# Patient Record
Sex: Female | Born: 1980 | Race: White | Hispanic: No | Marital: Married | Smoking: Former smoker
Health system: Southern US, Community
[De-identification: ages and names within clinical notes are randomized; demographics above are authoritative.]

## PROBLEM LIST (undated history)

## (undated) DIAGNOSIS — E785 Hyperlipidemia, unspecified: Secondary | ICD-10-CM

---

## 2001-04-14 ENCOUNTER — Other Ambulatory Visit: Admission: RE | Admit: 2001-04-14 | Discharge: 2001-04-14 | Payer: Self-pay | Admitting: Family Medicine

## 2008-11-11 ENCOUNTER — Ambulatory Visit (HOSPITAL_COMMUNITY): Admission: RE | Admit: 2008-11-11 | Discharge: 2008-11-11 | Payer: Self-pay | Admitting: Obstetrics and Gynecology

## 2009-04-17 ENCOUNTER — Encounter: Admission: RE | Admit: 2009-04-17 | Discharge: 2009-04-17 | Payer: Self-pay | Admitting: Orthopedic Surgery

## 2009-09-28 ENCOUNTER — Ambulatory Visit (HOSPITAL_COMMUNITY): Admission: RE | Admit: 2009-09-28 | Discharge: 2009-09-28 | Payer: Self-pay | Admitting: Obstetrics and Gynecology

## 2011-03-04 IMAGING — US US FOLLICLE
1 series · 14 of 16 positions shown · non-contrast
Comparison: None.

CLINICAL DATA: Infertility.  Follicle study

US PELVIS TRANSVAGINAL

[Series 1: us follicle · 0.12mm/px · 25 acquisitions, 14 frames shown]
[im 1/25]
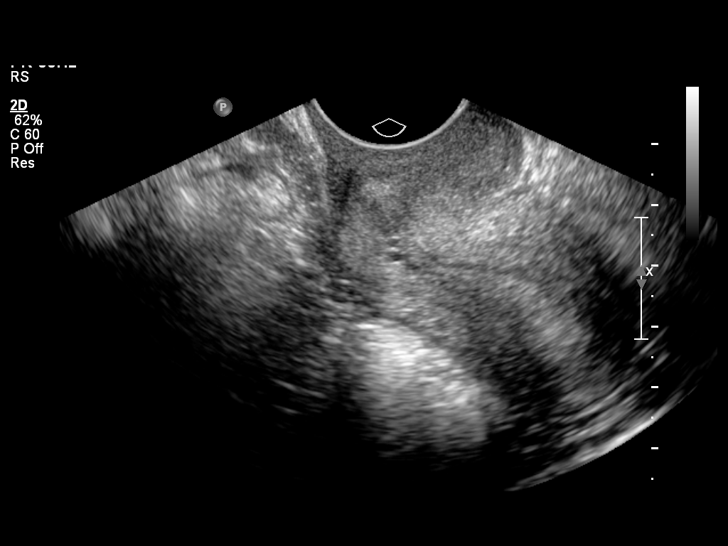
[im 2/25]
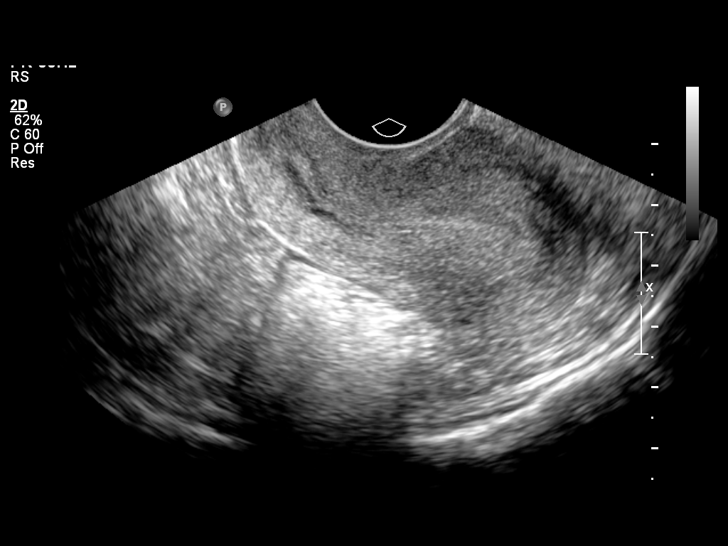
[im 4/25]
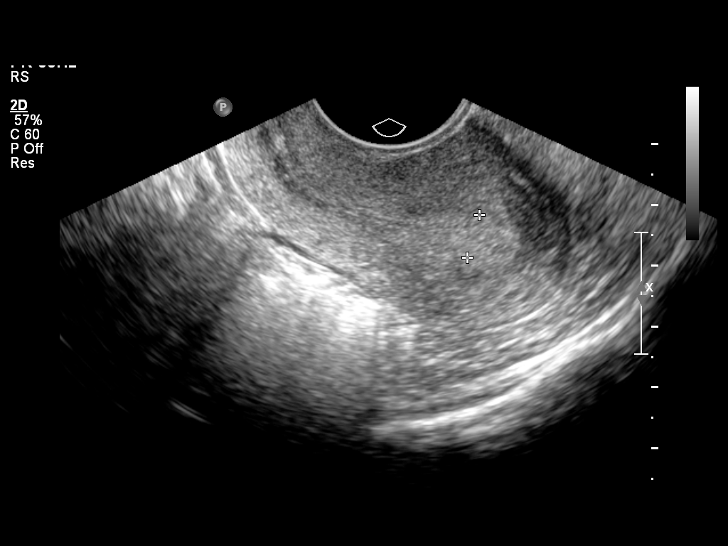
[im 7/25]
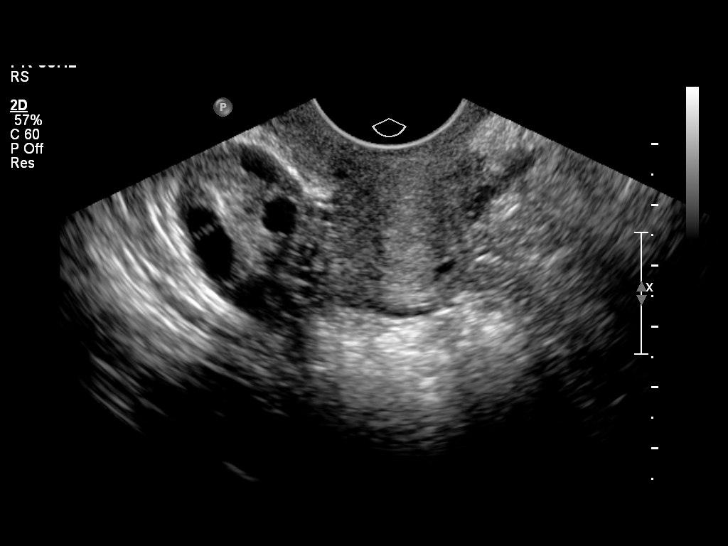
[im 9/25]
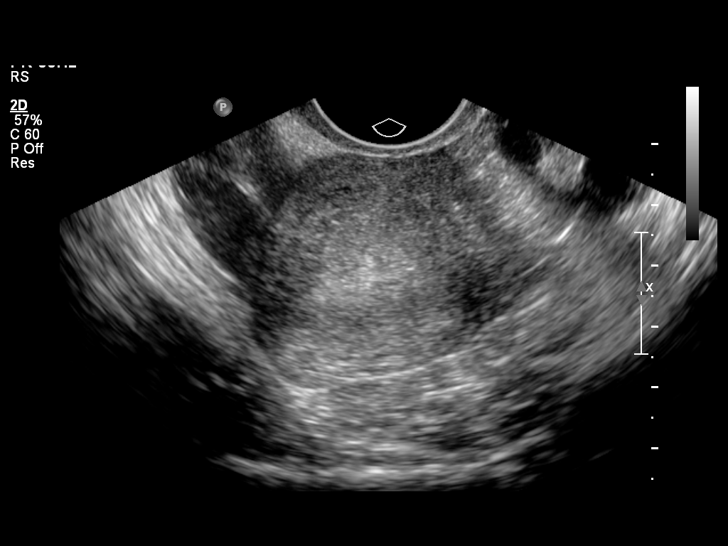
[im 10/25]
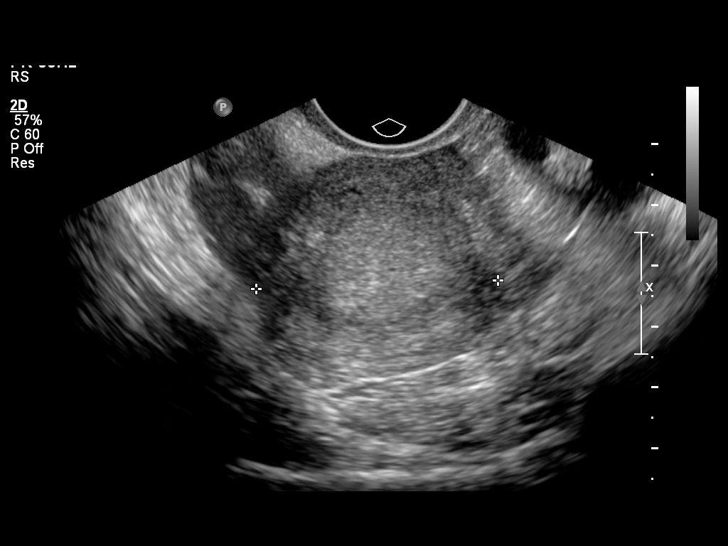
[im 12/25]
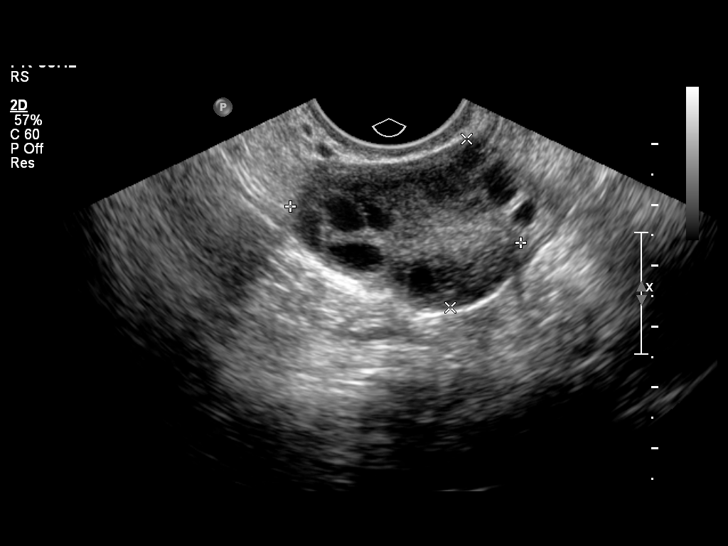
[im 13/25]
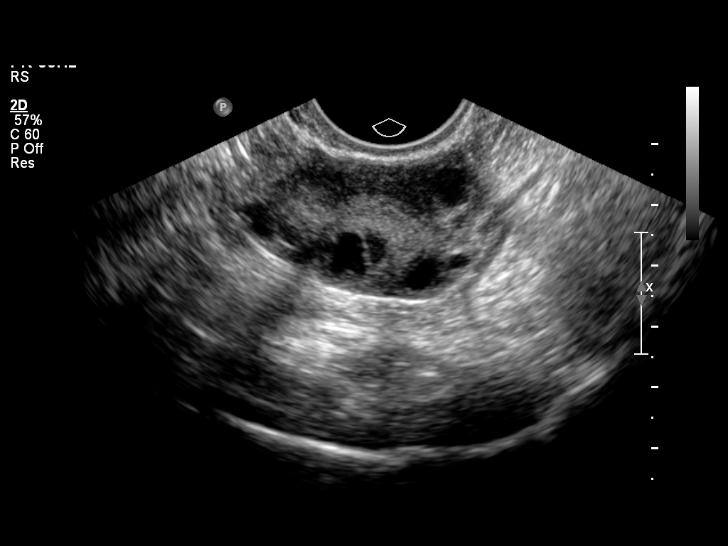
[im 15/25]
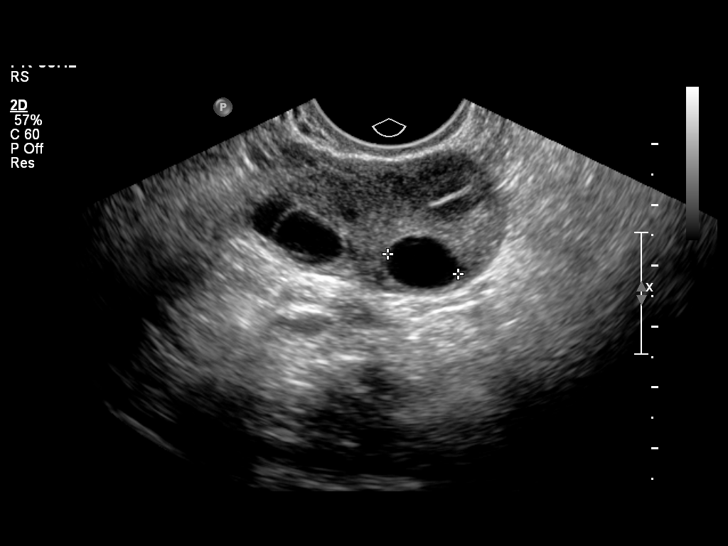
[im 17/25]
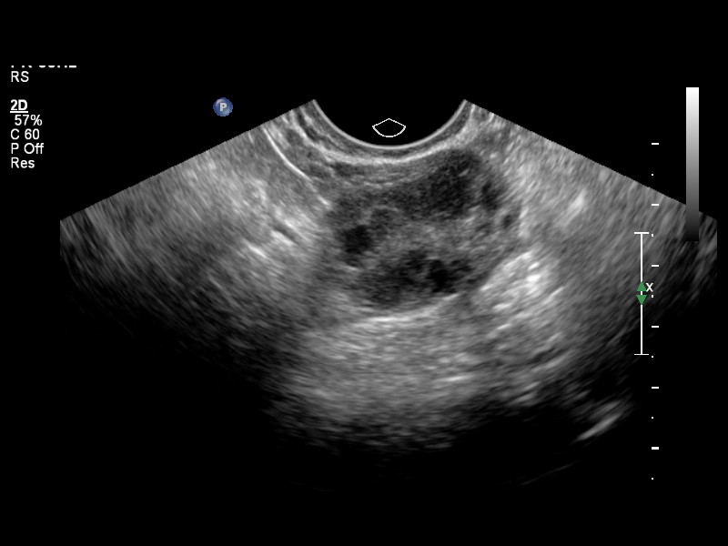
[im 20/25]
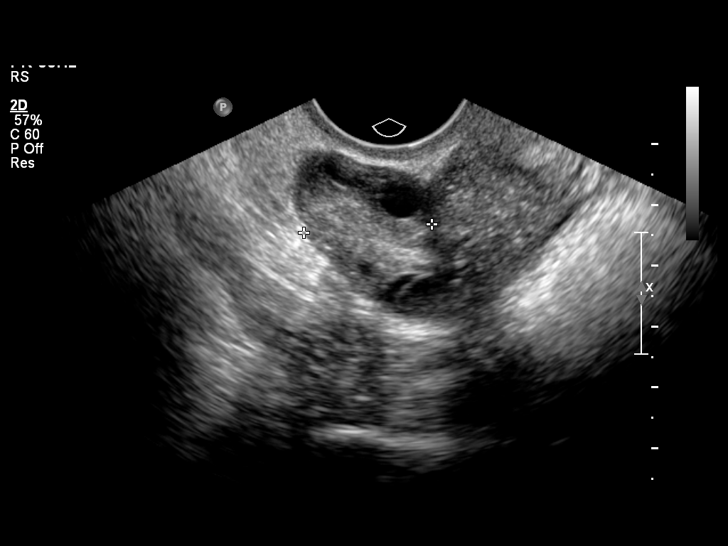
[im 21/25]
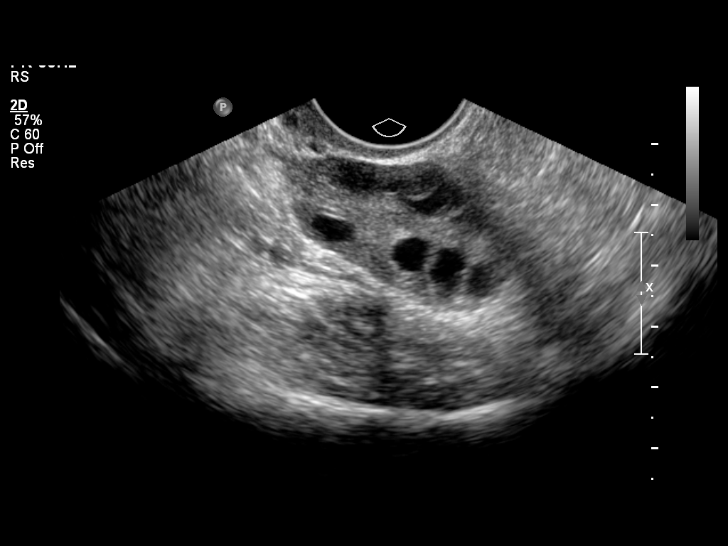
[im 23/25]
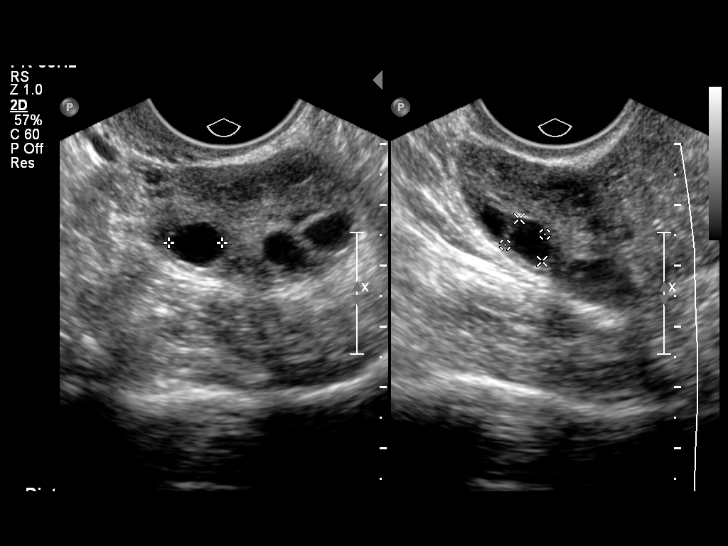
[im 25/25]
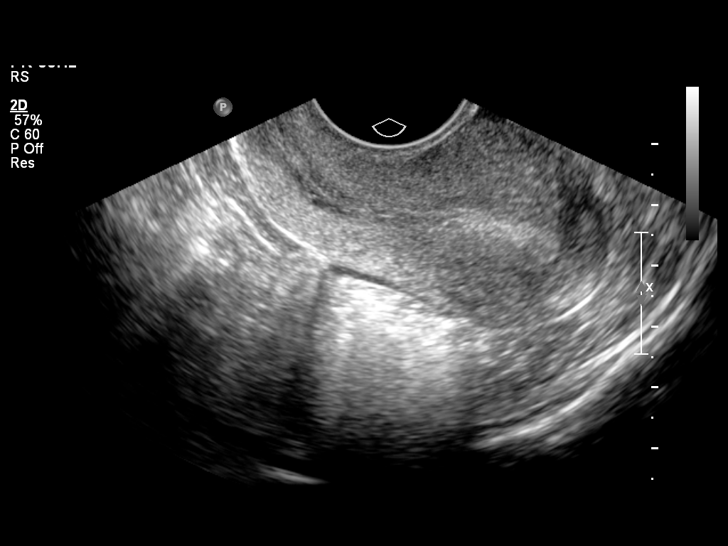

[14 of 16 positions shown; findings below may reference images not displayed]

FINDINGS: Uterus is retroverted and retroflexed.  Overall size
measures 6.2 x 4.0 x 3.3 cm.  Endometrium measures 8 mm,
inhomogeneously echogenic.

Right ovary:  14 sub centimeter follicles are noted.

Left ovary:  19 sub centimeter follicles are noted.  One follicle
measuring 1.3 x 1.2 x 0.7 cm is noted.
IMPRESSION: Follicle study as above.  Findings called to Dr. Intisar by
sonographer Milisek Mullerova at the time of the exam [DATE] p.m.
09/28/2009.

## 2013-10-16 ENCOUNTER — Emergency Department (INDEPENDENT_AMBULATORY_CARE_PROVIDER_SITE_OTHER)
Admission: EM | Admit: 2013-10-16 | Discharge: 2013-10-16 | Disposition: A | Discharge status: Home / Self Care | Payer: PRIVATE HEALTH INSURANCE | Source: Home / Self Care

## 2013-10-16 ENCOUNTER — Encounter: Payer: Self-pay | Admitting: Emergency Medicine

## 2013-10-16 DIAGNOSIS — R300 Dysuria: Secondary | ICD-10-CM

## 2013-10-16 DIAGNOSIS — N390 Urinary tract infection, site not specified: Secondary | ICD-10-CM

## 2013-10-16 DIAGNOSIS — N926 Irregular menstruation, unspecified: Secondary | ICD-10-CM

## 2013-10-16 HISTORY — DX: Hyperlipidemia, unspecified: E78.5

## 2013-10-16 LAB — POCT URINALYSIS DIP (MANUAL ENTRY)
Nitrite, UA: POSITIVE
Spec Grav, UA: <=1.005 (ref 1.005–1.03)
Urobilinogen, UA: 4 (ref 0–1)
pH, UA: 5 (ref 5–8)

## 2013-10-16 LAB — POCT URINE PREGNANCY: PREG TEST UR: NEGATIVE

## 2013-10-16 MED ORDER — PHENAZOPYRIDINE HCL 200 MG PO TABS: 200.0000 mg | ORAL_TABLET | Freq: Three times a day (TID) | ORAL | Status: AC

## 2013-10-16 MED ORDER — NITROFURANTOIN MONOHYD MACRO 100 MG PO CAPS: 100.0000 mg | ORAL_CAPSULE | Freq: Two times a day (BID) | ORAL | Status: DC

## 2013-10-16 MED ORDER — ONDANSETRON HCL 4 MG PO TABS: 4.0000 mg | ORAL_TABLET | Freq: Three times a day (TID) | ORAL | Status: DC | PRN

## 2013-10-16 NOTE — ED Provider Notes (Signed)
CSN: [id number]     Arrival date & time 10/16/13  [id number] History   None    Chief Complaint  Patient presents with  . Dysuria  . Back Pain   (Consider location/radiation/quality/duration/timing/severity/associated sxs/prior Treatment) Patient is a 33 y.o. female presenting with dysuria and back pain. The history is provided by the patient.  Dysuria Pain quality:  Aching and burning Pain severity:  Severe Onset quality:  Sudden Duration: Last night symptoms started. Timing:  Constant Progression:  Worsening Chronicity:  New Recent UTI: Has had infections in the past but been at least a year since the last one.   Relieved by:  Nothing Ineffective treatments:  Phenazopyridine, acetaminophen and NSAIDs Urinary symptoms: foul-smelling urine, frequent urination and hesitancy   Urinary symptoms: no discolored urine, no hematuria and no bladder incontinence   Associated symptoms: abdominal pain and nausea   Associated symptoms: no fever, no flank pain, no genital lesions, no vaginal discharge and no vomiting   Associated symptoms comment:  Lower back pain and abdominal discomfort.  Risk factors: no hx of pyelonephritis   Back Pain Associated symptoms: abdominal pain and dysuria   Associated symptoms: no fever     Last period was 08/18/13. Multiple home pregnancy test negative.   Past Medical History  Diagnosis Date  . Hyperlipidemia    History reviewed. No pertinent past surgical history. Family History  Problem Relation Age of Onset  . Heart attack Mother    History  Substance Use Topics  . Smoking status: Former Games developermoker  . Smokeless tobacco: Not on file  . Alcohol Use: No   OB History   Grav Para Term Preterm Abortions TAB SAB Ect Mult Living                 Review of Systems  Constitutional: Negative for fever.  Gastrointestinal: Positive for nausea and abdominal pain. Negative for vomiting.  Genitourinary: Positive for dysuria. Negative for flank pain and vaginal  discharge.  Musculoskeletal: Positive for back pain.    Allergies  Cleocin  Home Medications   Prior to Admission medications   Medication Sig Start Date End Date Taking? Authorizing Provider  nitrofurantoin, macrocrystal-monohydrate, (MACROBID) 100 MG capsule Take 1 capsule (100 mg total) by mouth 2 (two) times daily. For 7 days. 10/16/13   Rosalena Mccorry L Armandina Iman, PA-C  ondansetron (ZOFRAN) 4 MG tablet Take 1 tablet (4 mg total) by mouth every 8 (eight) hours as needed for nausea or vomiting. 10/16/13   Jomarie LongsJade L Jamae Tison, PA-C  phenazopyridine (PYRIDIUM) 200 MG tablet Take 1 tablet (200 mg total) by mouth 3 (three) times daily. 10/16/13 10/18/13  Daryon Remmert L Aydrien Froman, PA-C   BP 96/67  Pulse 80  Temp(Src) 98.1 F (36.7 C) (Oral)  Resp 16  Ht 5\' 1"  (1.549 m)  Wt 182 lb (82.555 kg)  BMI 34.41 kg/m2  SpO2 97%  LMP 08/18/2013 Physical Exam  Constitutional: She is oriented to person, place, and time. She appears well-developed and well-nourished.  HENT:  Head: Normocephalic and atraumatic.  Cardiovascular: Normal rate, regular rhythm and normal heart sounds.   Pulmonary/Chest: Effort normal and breath sounds normal.  No CVA tenderness.   Abdominal:  Abdominal discomfort over bilateral lower quadrant. No rebound or guarding.   Neurological: She is alert and oriented to person, place, and time.  Psychiatric: She has a normal mood and affect. Her behavior is normal.    ED Course  Procedures (including critical care time) Labs Review Labs Reviewed  URINE CULTURE  POCT URINE PREGNANCY  POCT URINALYSIS DIP (MANUAL ENTRY)  .Marland Kitchen. Results for orders placed during the hospital encounter of 10/16/13  POCT URINE PREGNANCY      Result Value Ref Range   Preg Test, Ur Negative    POCT URINALYSIS DIP (MANUAL ENTRY)      Result Value Ref Range   Color, UA orange     Clarity, UA clear     Glucose, UA =250     Bilirubin, UA small     Bilirubin, UA trace (5)     Spec Grav, UA <=1.005  1.005 - 1.03    Blood, UA trace-intact     pH, UA 5.0  5 - 8   Protein Ur, POC =100     Urobilinogen, UA 4.0  0 - 1   Nitrite, UA Positive     Leukocytes, UA large (3+)       Imaging Review No results found.   MDM   1. UTI (urinary tract infection)   2. Dysuria    Will culture. Treated with macrobid for 10 days. I do not suspect pyelonephritis at this point. Symptoms only present for 12 hours.  Zofran given for nausea.  Pyridium given to help with dysuria and pain.  Continue Ibuprofen.  Stay hydrated and drink plenty of water.  Red flag symptoms discussed. If pt spikes a fever, back pain worsens, nausea or vomiting continues. Please follow up in UC. If not improving can come back for shot of Rocephin IM.    3. Missed menses.  UPT negative today. Reassured pt. If menses does not return follow up with PCP.     Jomarie LongsJade L Brittini Brubeck, PA-C 10/16/13 1038  Jomarie LongsJade L Nubia Ziesmer, PA-C 10/16/13 1040

## 2013-10-16 NOTE — Discharge Instructions (Signed)
Urinary Tract Infection  Urinary tract infections (UTIs) can develop anywhere along your urinary tract. Your urinary tract is your body's drainage system for removing wastes and extra water. Your urinary tract includes two kidneys, two ureters, a bladder, and a urethra. Your kidneys are a pair of bean-shaped organs. Each kidney is about the size of your fist. They are located below your ribs, one on each side of your spine.  CAUSES  Infections are caused by microbes, which are microscopic organisms, including fungi, viruses, and bacteria. These organisms are so small that they can only be seen through a microscope. Bacteria are the microbes that most commonly cause UTIs.  SYMPTOMS   Symptoms of UTIs may vary by age and gender of the patient and by the location of the infection. Symptoms in young women typically include a frequent and intense urge to urinate and a painful, burning feeling in the bladder or urethra during urination. Older women and men are more likely to be tired, shaky, and weak and have muscle aches and abdominal pain. A fever may mean the infection is in your kidneys. Other symptoms of a kidney infection include pain in your back or sides below the ribs, nausea, and vomiting.  DIAGNOSIS  To diagnose a UTI, your caregiver will ask you about your symptoms. Your caregiver also will ask to provide a urine sample. The urine sample will be tested for bacteria and white blood cells. White blood cells are made by your body to help fight infection.  TREATMENT   Typically, UTIs can be treated with medication. Because most UTIs are caused by a bacterial infection, they usually can be treated with the use of antibiotics. The choice of antibiotic and length of treatment depend on your symptoms and the type of bacteria causing your infection.  HOME CARE INSTRUCTIONS   If you were prescribed antibiotics, take them exactly as your caregiver instructs you. Finish the medication even if you feel better after you  have only taken some of the medication.   Drink enough water and fluids to keep your urine clear or pale yellow.   Avoid caffeine, tea, and carbonated beverages. They tend to irritate your bladder.   Empty your bladder often. Avoid holding urine for long periods of time.   Empty your bladder before and after sexual intercourse.   After a bowel movement, women should cleanse from front to back. Use each tissue only once.  SEEK MEDICAL CARE IF:    You have back pain.   You develop a fever.   Your symptoms do not begin to resolve within 3 days.  SEEK IMMEDIATE MEDICAL CARE IF:    You have severe back pain or lower abdominal pain.   You develop chills.   You have nausea or vomiting.   You have continued burning or discomfort with urination.  MAKE SURE YOU:    Understand these instructions.   Will watch your condition.   Will get help right away if you are not doing well or get worse.  Document Released: 03/03/2005 Document Revised: 11/23/2011 Document Reviewed: 07/02/2011  [employer] Patient Information 2014 [employer].

## 2013-10-16 NOTE — ED Notes (Signed)
Pt c/o dysuria, low back pain, and nausea x 1 day. Denies fever. She has taken AZO.

## 2013-10-17 LAB — URINE CULTURE
COLONY COUNT: NO GROWTH
ORGANISM ID, BACTERIA: NO GROWTH

## 2013-10-17 NOTE — ED Provider Notes (Signed)
Agree with exam, assessment, and plan.   Stephen A Beese, MD 10/17/13 1700 

## 2013-10-19 ENCOUNTER — Telehealth: Payer: Self-pay | Admitting: *Deleted

## 2015-03-26 ENCOUNTER — Emergency Department (INDEPENDENT_AMBULATORY_CARE_PROVIDER_SITE_OTHER)
Admission: EM | Admit: 2015-03-26 | Discharge: 2015-03-26 | Disposition: A | Discharge status: Home / Self Care | Payer: PRIVATE HEALTH INSURANCE | Source: Home / Self Care | Attending: Family Medicine | Admitting: Family Medicine

## 2015-03-26 ENCOUNTER — Encounter: Payer: Self-pay | Admitting: Emergency Medicine

## 2015-03-26 ENCOUNTER — Emergency Department (INDEPENDENT_AMBULATORY_CARE_PROVIDER_SITE_OTHER): Payer: PRIVATE HEALTH INSURANCE

## 2015-03-26 DIAGNOSIS — M79671 Pain in right foot: Secondary | ICD-10-CM | POA: Diagnosis not present

## 2015-03-26 DIAGNOSIS — S9031XA Contusion of right foot, initial encounter: Secondary | ICD-10-CM | POA: Diagnosis not present

## 2015-03-26 MED ORDER — MELOXICAM 15 MG PO TABS: 15.0000 mg | ORAL_TABLET | Freq: Every day | ORAL | Status: DC

## 2015-03-26 NOTE — ED Provider Notes (Signed)
CSN: [id number]     Arrival date & time 03/26/15  1131 History   First MD Initiated Contact with Patient 03/26/15 1142     Chief Complaint  Patient presents with  . Foot Injury     HPI Comments: Patient reports that two months ago she dropped a can of food on the top of her right foot.  She has had persistent pain in the dorsum of her foot.  Patient is a 34 y.o. female presenting with lower extremity pain. The history is provided by the patient.  Foot Pain This is a new problem. Episode onset: 2 months ago. The problem occurs constantly. The problem has not changed since onset.Associated symptoms comments: none. The symptoms are aggravated by walking. Nothing relieves the symptoms. She has tried nothing for the symptoms.    Past Medical History  Diagnosis Date  . Hyperlipidemia    History reviewed. No pertinent past surgical history. Family History  Problem Relation Age of Onset  . Heart attack Mother   . Hyperlipidemia Father   . Hypertension Father    Social History  Substance Use Topics  . Smoking status: Former Games developer  . Smokeless tobacco: None  . Alcohol Use: No   OB History    No data available     Review of Systems  All other systems reviewed and are negative.   Allergies  Cleocin  Home Medications   Prior to Admission medications   Medication Sig Start Date End Date Taking? Authorizing Provider  meloxicam (MOBIC) 15 MG tablet Take 1 tablet (15 mg total) by mouth daily. Take with food each morning 03/26/15   Lattie Haw, MD   Meds Ordered and Administered this Visit  Medications - No data to display  BP 106/68 mmHg  Pulse 82  Temp(Src) 98.1 F (36.7 C) (Oral)  Ht  (1.549 m)  Wt 175 lb (79.379 kg)  BMI 33.08 kg/m2  SpO2 100% No data found.   Physical Exam  Constitutional: She is oriented to person, place, and time. She appears well-developed and well-nourished. No distress.  Patient is obese (BMI 33.1)  HENT:  Head: Atraumatic.  Eyes:  Pupils are equal, round, and reactive to light.  Musculoskeletal:       Right foot: There is tenderness and bony tenderness. There is normal range of motion, no swelling, normal capillary refill, no crepitus, no deformity and no laceration.       Feet:  Dorsum of the right foot has tenderness to palpation over extensor tendons as noted on diagram.  Pain is elicited by palpating the dorsum during resisted dorsiflexion of the foot.  Distal neurovascular function is intact.     Neurological: She is alert and oriented to person, place, and time.  Skin: Skin is warm and dry. No erythema.  Nursing note and vitals reviewed.   ED Course  Proceduresnone   Imaging Review Dg Foot Complete Right  03/26/2015  CLINICAL DATA:  Crush injury to the first/second metatarsal region of the right foot 2 months prior with persistent pain. EXAM: RIGHT FOOT COMPLETE - 3+ VIEW COMPARISON:  04/17/2009 right foot CT. FINDINGS: No fracture or dislocation. Lisfranc joint appears intact. No aggressive-appearing focal osseous lesions. No appreciable degenerative or erosive arthropathy. Soft tissues are unremarkable. Small plantar and tiny Achilles right calcaneal spurs. IMPRESSION: Negative. Electronically Signed   By: Delbert Phenix M.D.   On: 03/26/2015 12:14     MDM   1. Contusion of right foot, initial encounter; suspect  extensor tendonitis   Ace wrap applied.  Begin Mobic 15mg  QAM Wear ace wrap daytime.  Apply ice pack for 20 to 30 minutes, 2 to 3 times times daily  Continue until pain decreases.  May take Tylenol at bedtime as needed for pain. Followup with Dr. Rodney Langtonhomas Thekkekandam or Dr. Clementeen GrahamEvan Core[facility] ([facility]) if not improving about two weeks.     Lattie HawStephen A Beese, MD 03/27/15 [phone]

## 2015-03-26 NOTE — Discharge Instructions (Signed)
Wear ace wrap daytime.  Apply ice pack for 20 to 30 minutes, 2 to 3 times times daily  Continue until pain decreases.  May take Tylenol at bedtime as needed for pain.   Foot Contusion A foot contusion is a deep bruise to the foot. Contusions are the result of an injury that caused bleeding under the skin. The contusion may turn blue, purple, or yellow. Minor injuries will give you a painless contusion, but more severe contusions may stay painful and swollen for a few weeks. CAUSES  A foot contusion comes from a direct blow to that area, such as a heavy object falling on the foot. SYMPTOMS   Swelling of the foot.  Discoloration of the foot.  Tenderness or soreness of the foot. DIAGNOSIS  You will have a physical exam and will be asked about your history. You may need an X-ray of your foot to look for a broken bone (fracture).  TREATMENT  An elastic wrap may be recommended to support your foot. Resting, elevating, and applying cold compresses to your foot are often the best treatments for a foot contusion. Over-the-counter medicines may also be recommended for pain control. HOME CARE INSTRUCTIONS   Put ice on the injured area.  Put ice in a plastic bag.  Place a towel between your skin and the bag.  Leave the ice on for 15-20 minutes, 03-04 times a day.  Only take over-the-counter or prescription medicines for pain, discomfort, or fever as directed by your caregiver.  If told, use an elastic wrap as directed. This can help reduce swelling. You may remove the wrap for sleeping, showering, and bathing. If your toes become numb, cold, or blue, take the wrap off and reapply it more loosely.  Elevate your foot with pillows to reduce swelling.  Try to avoid standing or walking while the foot is painful. Do not resume use until instructed by your caregiver. Then, begin use gradually. If pain develops, decrease use. Gradually increase activities that do not cause discomfort until you have  normal use of your foot.  See your caregiver as directed. It is very important to keep all follow-up appointments in order to avoid any lasting problems with your foot, including long-term (chronic) pain. SEEK IMMEDIATE MEDICAL CARE IF:   You have increased redness, swelling, or pain in your foot.  Your swelling or pain is not relieved with medicines.  You have loss of feeling in your foot or are unable to move your toes.  Your foot turns cold or blue.  You have pain when you move your toes.  Your foot becomes warm to the touch.  Your contusion does not improve in 2 days. MAKE SURE YOU:   Understand these instructions.  Will watch your condition.  Will get help right away if you are not doing well or get worse.   This information is not intended to replace advice given to you by your health care provider. Make sure you discuss any questions you have with your health care provider.   Document Released: 03/15/2006 Document Revised: 11/23/2011 Document Reviewed: 01/28/2015 Elsevier Interactive Patient Education [employer]2016 [employer]

## 2015-03-26 NOTE — ED Notes (Signed)
Rt foot injury, dropped a can of food on top of right foot 2 months ago

## 2016-08-29 IMAGING — CR DG FOOT COMPLETE 3+V*R*
3 series · 3 of 3 positions shown · non-contrast
Comparison: 04/17/2009 right foot CT.

CLINICAL DATA: Crush injury to the [DATE] metatarsal region
of the right foot 2 months prior with persistent pain.

EXAM:
RIGHT FOOT COMPLETE - 3+ VIEW

[foot ap]
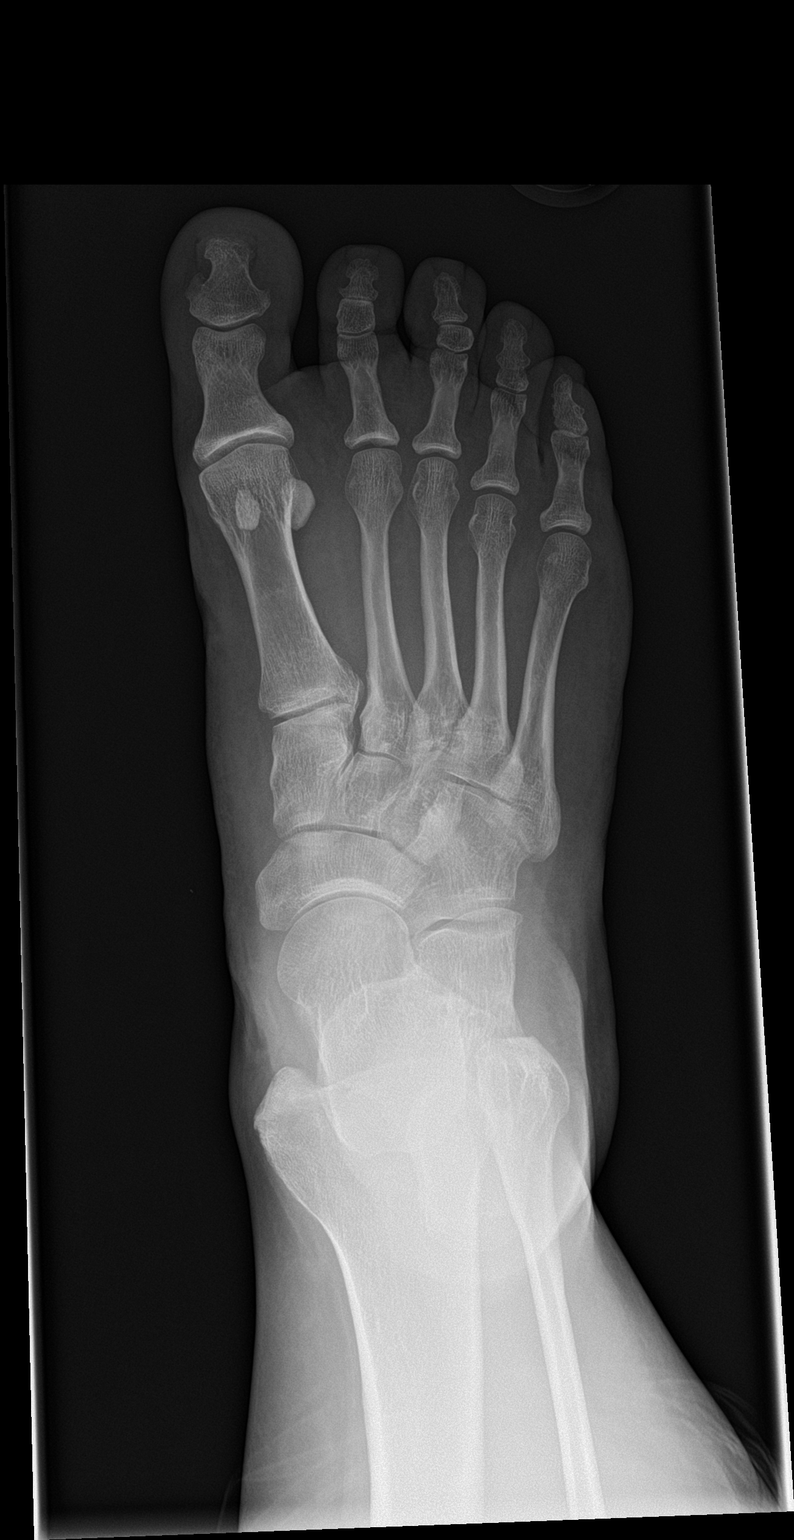

[foot obl]
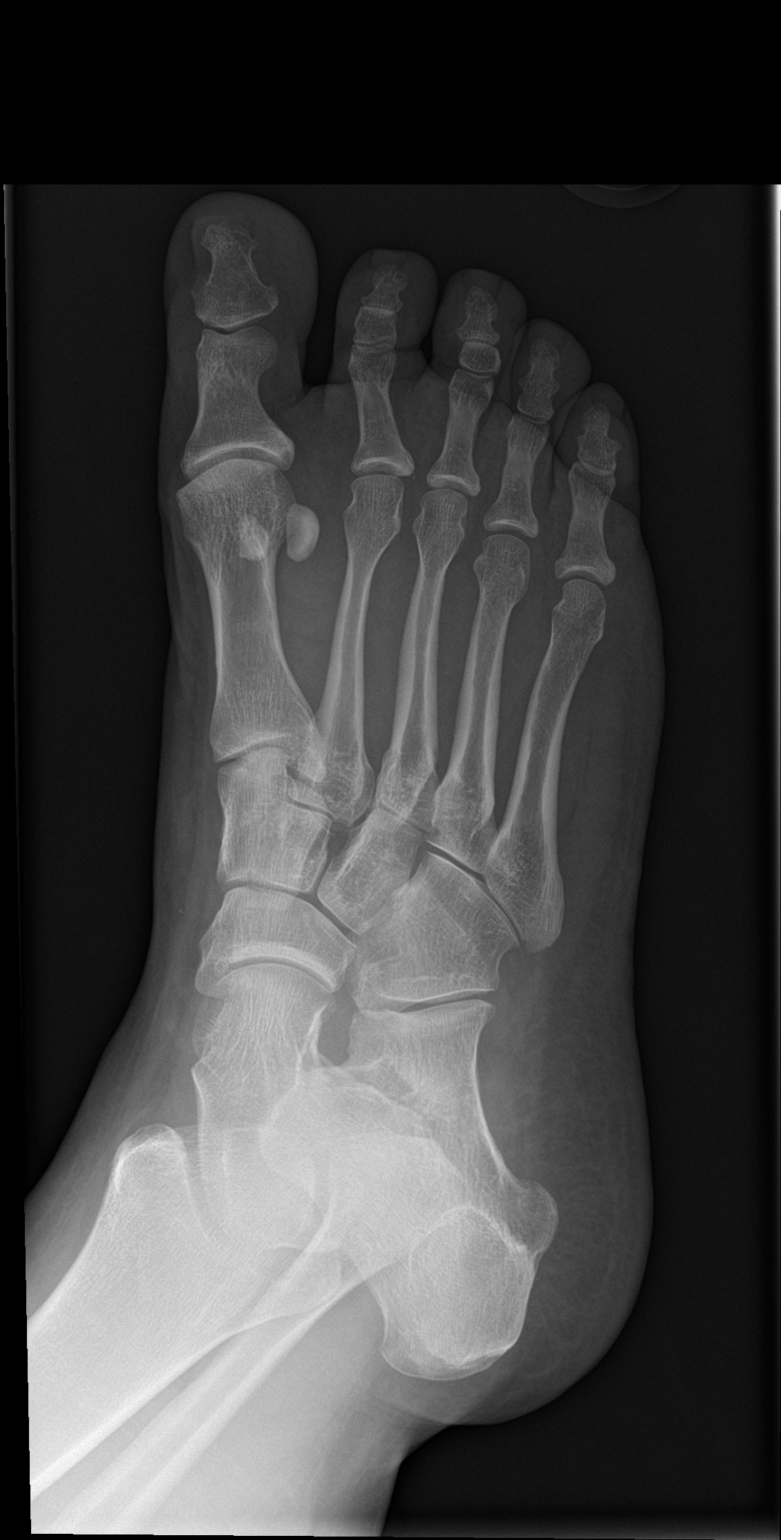

[foot lat]
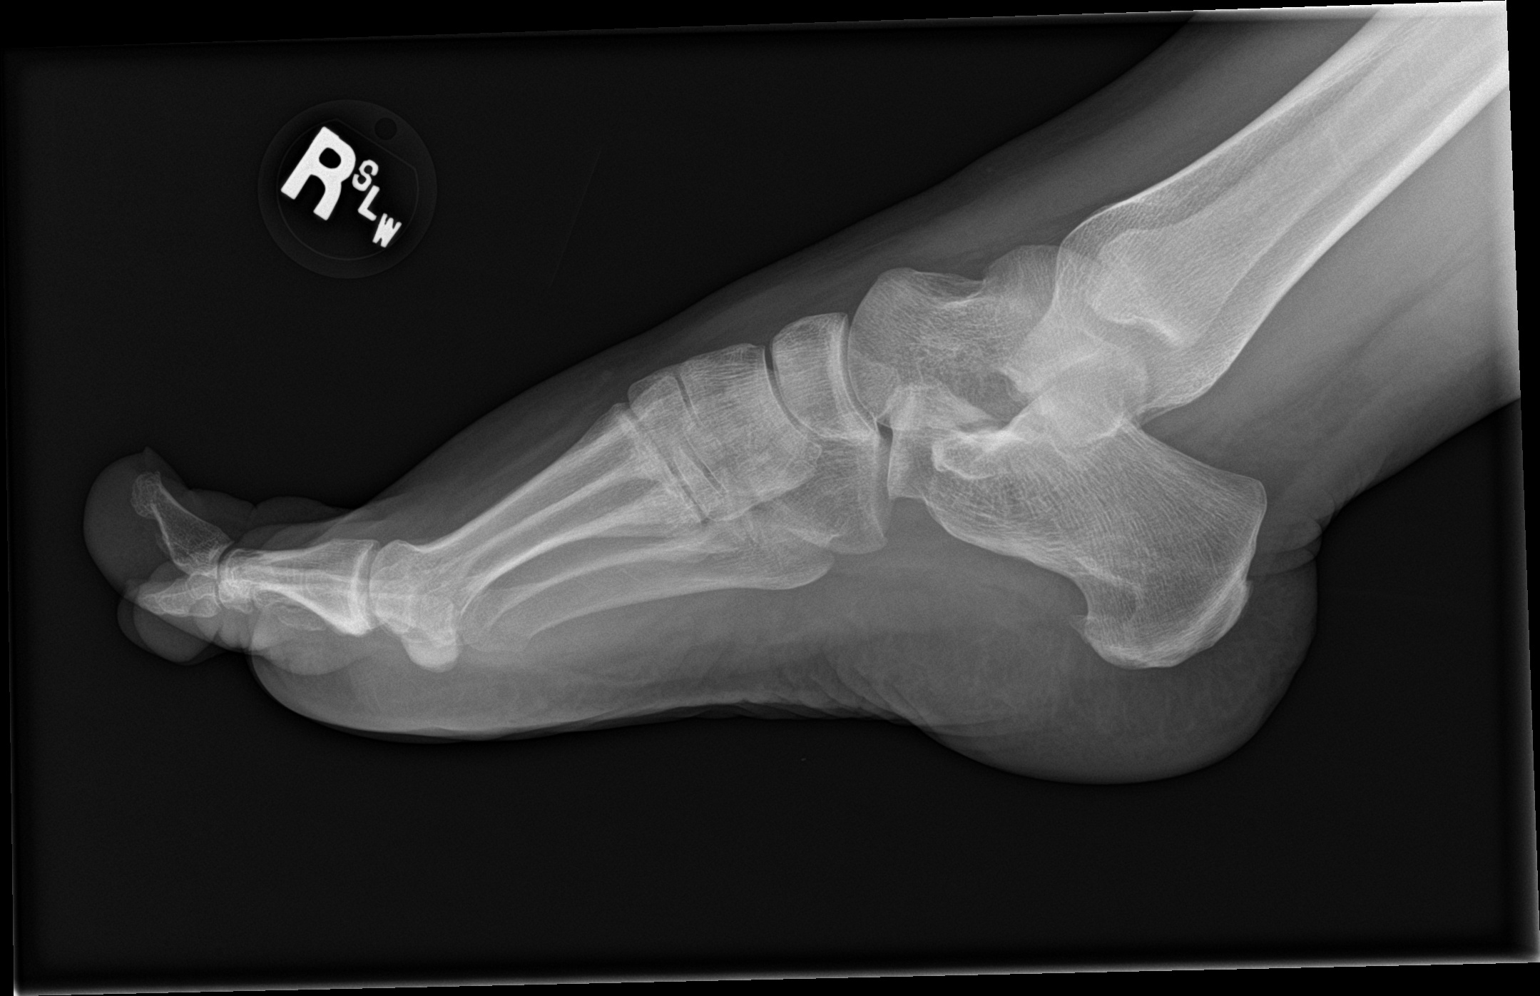

[3 of 3 positions shown; findings below may reference images not displayed]

FINDINGS: No fracture or dislocation. Lisfranc joint appears intact. No
aggressive-appearing focal osseous lesions. No appreciable
degenerative or erosive arthropathy. Soft tissues are unremarkable.
Small plantar and tiny Achilles right calcaneal spurs.
IMPRESSION: Negative.

## 2018-01-25 ENCOUNTER — Telehealth: Payer: Self-pay | Admitting: Emergency Medicine

## 2018-01-25 ENCOUNTER — Other Ambulatory Visit: Payer: Self-pay

## 2018-01-25 ENCOUNTER — Encounter: Payer: Self-pay | Admitting: Emergency Medicine

## 2018-01-25 ENCOUNTER — Emergency Department
Admission: EM | Admit: 2018-01-25 | Discharge: 2018-01-25 | Disposition: A | Discharge status: Home / Self Care | Payer: Federal, State, Local not specified - PPO | Source: Home / Self Care | Attending: Emergency Medicine | Admitting: Emergency Medicine

## 2018-01-25 DIAGNOSIS — G43101 Migraine with aura, not intractable, with status migrainosus: Secondary | ICD-10-CM | POA: Diagnosis not present

## 2018-01-25 MED ORDER — SUMATRIPTAN SUCCINATE 100 MG PO TABS: ORAL_TABLET | ORAL | 0 refills | Status: DC

## 2018-01-25 MED ORDER — ONDANSETRON 4 MG PO TBDP: 4.0000 mg | ORAL_TABLET | Freq: Three times a day (TID) | ORAL | 0 refills | Status: AC | PRN

## 2018-01-25 MED ORDER — ONDANSETRON 4 MG PO TBDP
4.0000 mg | ORAL_TABLET | ORAL | Status: AC
  Administered 2018-01-25: 4 mg via ORAL

## 2018-01-25 MED ORDER — ONDANSETRON 4 MG PO TBDP: 4.0000 mg | ORAL_TABLET | Freq: Three times a day (TID) | ORAL | 0 refills | Status: DC | PRN

## 2018-01-25 MED ORDER — SUMATRIPTAN SUCCINATE 100 MG PO TABS: ORAL_TABLET | ORAL | 0 refills | Status: AC

## 2018-01-25 MED ORDER — KETOROLAC TROMETHAMINE 60 MG/2ML IM SOLN
60.0000 mg | Freq: Once | INTRAMUSCULAR | Status: AC
  Administered 2018-01-25: 60 mg via INTRAMUSCULAR

## 2018-01-25 NOTE — Discharge Instructions (Signed)
Here in urgent care, we gave you 4 mg of Zofran by mouth and a shot of Toradol 60 mg.  Your migraine headache improved. Please read attached instruction sheet on recurrent migraines. For now, go home and rest, and sleep if that helps your migraines.  No work today. Prescription sent to your pharmacy for Zofran, and also for generic Imitrex, if needed for recurrence of migraine and/or nausea. Its crucially important that you follow-up with your PCP or with neurologist within 1 week to reevaluate migraines and overall plan to prevent and treat your migraines. If any severe or worsening symptoms, go to hospital emergency room

## 2018-01-25 NOTE — ED Provider Notes (Signed)
[facility]    CSN: [id number] Arrival date & time: 01/25/18  1416     History   Chief Complaint Chief Complaint  Patient presents with  . Migraine    HPI Algie Cofferlesia P Jacqueline Bryan is a 37 y.o. female.   HPI Left parietal headache, onset last night.  Was severe, now is moderate intensity.  Headache is dull and deep.  Just prior to that, had migraine aura of some blurry vision and tingly sensation in fingertips, and the aura resolved.  She states that she would normally take Excedrin Migraine, but she did not have any at home, so she treated it with rest and sleep which helped the migraine a little bit. Associated symptoms: Nausea without vomiting.  She took one Zofran 5 hours ago, and that helped the nausea.  No diarrhea or abdominal pain.  Denies chest pain, shortness of breath, fever, rash, myalgias. Has photophobia currently, but no vision change at this time. Last menstrual period ended 12/24/2017, but she just started her menstrual cycle with some light spotting today, which is the usual process for the first day of her menstrual cycle.  Denies any pelvic or vaginal symptoms otherwise. She denies any flulike symptoms or any recent URIs or other infections.  Past Medical History:  Diagnosis Date  . Hyperlipidemia   She states she has had migraines since teenager.  Many years ago, [facility], and was treated there for several years, but lost to follow-up.  She continued to have migraines periodically, but migraines now are worse the past several years.  Has not seen her PCP for this. She denies history of cardiovascular disease, or diabetes. There are no active problems to display for this patient.   History reviewed. No pertinent surgical history.  OB History   None      Home Medications    Prior to Admission medications   Medication Sig Start Date End Date Taking? Authorizing Provider  escitalopram (LEXAPRO) 10 MG tablet  Take 10 mg by mouth daily.   Yes [provider]  ondansetron (ZOFRAN-ODT) 4 MG disintegrating tablet Take 1 tablet (4 mg total) by mouth every 8 (eight) hours as needed for nausea or vomiting. 01/25/18   Lajean ManesMassey, David, MD  SUMAtriptan (IMITREX) 100 MG tablet May repeat in 2 hours if headache persists or recurs.  Maximum of 2 doses every 24 hours 01/25/18   Lajean ManesMassey, David, MD  On Lexapro 10 mg daily.  No other prescription meds.  Family History Family History  Problem Relation Age of Onset  . Heart attack Mother   . Hyperlipidemia Father   . Hypertension Father   . Stroke Father   . Diabetes Brother   . Hyperlipidemia Brother    Reviewed above family history of MI, stroke, diabetes Social History Social History   Tobacco Use  . Smoking status: Former Games developermoker  . Smokeless tobacco: Never Used  Substance Use Topics  . Alcohol use: No  . Drug use: No  Does not currently smoke.  Denies alcohol or drug use.   Allergies   Cleocin [clindamycin hcl]   Review of Systems Review of Systems  Constitutional: Positive for fatigue. Negative for fever.  HENT: Negative for congestion and trouble swallowing.   Eyes: Positive for photophobia. Negative for visual disturbance.  Respiratory: Negative for shortness of breath.   Cardiovascular: Negative for chest pain, palpitations and leg swelling.  Gastrointestinal: Positive for nausea. Negative for abdominal distention, abdominal pain, blood in stool,  diarrhea and vomiting.  Genitourinary: Negative for difficulty urinating.  Musculoskeletal: Negative for neck stiffness.  Neurological: Negative for seizures, syncope, facial asymmetry and speech difficulty.  Psychiatric/Behavioral: Negative for confusion.  All other systems reviewed and are negative.    Physical Exam Triage Vital Signs ED Triage Vitals  Enc Vitals Group     BP 01/25/18 1445 101/69     Pulse Rate 01/25/18 1445 79     Resp --      Temp 01/25/18 1445 98 F (36.7 C)       Temp Source 01/25/18 1445 Oral     SpO2 01/25/18 1445 99 %     Weight 01/25/18 1446 182 lb (82.6 kg)     Height 01/25/18 1446 5\' 1"  (1.549 m)     Head Circumference --      Peak Flow --      Pain Score 01/25/18 1445 10     Pain Loc --      Pain Edu? --      Excl. in GC? --    No data found.  Updated Vital Signs BP 101/69 (BP Location: Right Arm)   Pulse 79   Temp 98 F (36.7 C) (Oral)   Ht 5\' 1"  (1.549 m)   Wt 82.6 kg   LMP 12/22/2017 (Exact Date)   SpO2 99%   BMI 34.39 kg/m   Visual Acuity Right Eye Distance:   Left Eye Distance:   Bilateral Distance:    Right Eye Near:   Left Eye Near:    Bilateral Near:     Physical Exam  Constitutional: She appears well-developed and well-nourished. She appears distressed (Uncomfortable from headache.).  HENT:  Head: Normocephalic and atraumatic. Head is without raccoon's eyes, without contusion, without right periorbital erythema and without left periorbital erythema.  Right Ear: External ear and ear canal normal. No drainage. Tympanic membrane is not erythematous.  Left Ear: External ear and ear canal normal. No drainage. Tympanic membrane is not erythematous.  Nose: Nose normal. Right sinus exhibits no maxillary sinus tenderness. Left sinus exhibits no maxillary sinus tenderness.  Mouth/Throat: Oropharynx is clear and moist and mucous membranes are normal. No oral lesions. No dental abscesses. No oropharyngeal exudate, posterior oropharyngeal edema or posterior oropharyngeal erythema.      No cranial bruits.  Normal temporal arteries.  Eyes: Pupils are equal, round, and reactive to light. Conjunctivae, EOM and lids are normal.  Fundoscopic exam:      The right eye shows no exudate, no hemorrhage and no papilledema.       The left eye shows no exudate, no hemorrhage and no papilledema.  Neck: Neck supple. Normal carotid pulses and no JVD present. Carotid bruit is not present. No tracheal deviation present. No thyromegaly  present.  Cardiovascular: Regular rhythm and normal heart sounds.  Pulmonary/Chest: Effort normal and breath sounds normal. No respiratory distress. She has no wheezes. She has no rales.  Abdominal: Soft. There is no tenderness.  Musculoskeletal: She exhibits no edema or tenderness.  Neurological: She is alert. She has normal strength and normal reflexes. No cranial nerve deficit or sensory deficit. Coordination normal.  Reflex Scores:      Tricep reflexes are 2+ on the right side and 2+ on the left side.      Bicep reflexes are 2+ on the right side and 2+ on the left side.      Brachioradialis reflexes are 2+ on the right side and 2+ on the left side.  Patellar reflexes are 2+ on the right side and 2+ on the left side.      Achilles reflexes are 2+ on the right side and 2+ on the left side. Skin: Skin is warm and dry. No rash noted.  Psychiatric: She has a normal mood and affect.  Nursing note and vitals reviewed.    UC Treatments / Results  Labs (all labs ordered are listed, but only abnormal results are displayed) Labs Reviewed - No data to display  EKG None  Radiology No results found.  Procedures Procedures (including critical care time)  Medications Ordered in UC Medications  ketorolac (TORADOL) injection 60 mg (60 mg Intramuscular Given 01/25/18 1459)  ondansetron (ZOFRAN-ODT) disintegrating tablet 4 mg (4 mg Oral Given 01/25/18 1500)    Initial Impression / Assessment and Plan / UC Course  Based on history and physical exam, this is likely acute migraine. Might of been triggered by starting menstrual period today.  No focal neurologic abnormality. We will treat now with stat Toradol 60 mg IM and Zofran 4 mg p.o./ODT  I have reviewed the triage vital signs and the nursing notes.  Pertinent labs & imaging results that were available during my care of the patient were reviewed by me and considered in my medical decision making (see chart for details).     When  rechecking patient at 3:34 PM, her migraine had significantly improved and her pain level was decreased from 8 out of 10 and is now 3 or 4 out of 10.  She felt better and declined any other treatment options here in urgent care. Final Clinical Impressions(s) / UC Diagnoses   Final diagnoses:  Migraine with aura and with status migrainosus, not intractable  An After Visit Summary was printed and given to the patient. Precautions discussed. Red Flags discussed. The patient was given clear instructions to go to ER or return to medical center if any red flags develop, symptoms do not improve, worsen or new problems develop. They verbalized understanding. Questions invited and answered. Patient voiced understanding and agreement.    Discharge Instructions     Here in urgent care, we gave you 4 mg of Zofran by mouth and a shot of Toradol 60 mg.  Your migraine headache improved. Please read attached instruction sheet on recurrent migraines. For now, go home and rest, and sleep if that helps your migraines.  No work today. Prescription sent to your pharmacy for Zofran, and also for generic Imitrex, if needed for recurrence of migraine and/or nausea. Its crucially important that you follow-up with your PCP or with neurologist within 1 week to reevaluate migraines and overall plan to prevent and treat your migraines. If any severe or worsening symptoms, go to hospital emergency room    ED Prescriptions    Medication Sig Dispense Auth. Provider   ondansetron (ZOFRAN-ODT) 4 MG disintegrating tablet Take 1 tablet (4 mg total) by mouth every 8 (eight) hours as needed for nausea or vomiting. 5 tablet Lajean ManesMassey, David, MD   SUMAtriptan [facility](IMITREX) 100 MG tablet May repeat in 2 hours if headache persists or recurs.  Maximum of 2 doses every 24 hours 10 tablet Lajean ManesMassey, David, MD     Controlled Substance Prescriptions [place] Controlled Substance Registry consulted? Not Applicable   Lajean ManesMassey, David, MD 01/25/18  [phone]

## 2018-01-25 NOTE — ED Triage Notes (Signed)
Migraine since last night, forehead, nausea, light sensitivity

## 2019-02-21 ENCOUNTER — Other Ambulatory Visit: Payer: Self-pay

## 2019-02-21 ENCOUNTER — Emergency Department (INDEPENDENT_AMBULATORY_CARE_PROVIDER_SITE_OTHER)
Admission: EM | Admit: 2019-02-21 | Discharge: 2019-02-21 | Disposition: A | Discharge status: Home / Self Care | Payer: Federal, State, Local not specified - PPO | Source: Home / Self Care | Attending: Family Medicine | Admitting: Family Medicine

## 2019-02-21 ENCOUNTER — Encounter: Payer: Self-pay | Admitting: Emergency Medicine

## 2019-02-21 DIAGNOSIS — L03011 Cellulitis of right finger: Secondary | ICD-10-CM

## 2019-02-21 MED ORDER — MUPIROCIN 2 % EX OINT: 1.0000 "application " | TOPICAL_OINTMENT | Freq: Three times a day (TID) | CUTANEOUS | 0 refills | Status: AC

## 2019-02-21 MED ORDER — MUPIROCIN 2 % EX OINT: 1.0000 "application " | TOPICAL_OINTMENT | Freq: Three times a day (TID) | CUTANEOUS | 0 refills | Status: DC

## 2019-02-21 NOTE — Discharge Instructions (Addendum)
Soak the affected area in warm water 2 to 3 times daily.  Keep the finger dry when you are not soaking it and change the bandage daily.

## 2019-02-21 NOTE — ED Provider Notes (Addendum)
[facility]    CSN: [id number] Arrival date & time: 02/21/19  1357      History   Chief Complaint Chief Complaint  Patient presents with  . Hand Pain    right middle    HPI Jacqueline Bryan is a 38 y.o. female.   Patient had a manicure and artificial nails removed 5 days ago.  She has now developed pain/swelling of her right third fingertip with small amounts of purulent drainage  The history is provided by the patient.  Hand Pain This is a new problem. Episode onset: 2 to 3 days ago. The problem occurs constantly. The problem has been gradually worsening. Exacerbated by: contact. Nothing relieves the symptoms. She has tried nothing for the symptoms.    Past Medical History:  Diagnosis Date  . Hyperlipidemia     There are no active problems to display for this patient.   History reviewed. No pertinent surgical history.  OB History   No obstetric history on file.      Home Medications    Prior to Admission medications   Medication Sig Start Date End Date Taking? Authorizing Provider  escitalopram (LEXAPRO) 10 MG tablet Take 10 mg by mouth daily.    [provider]  mupirocin ointment (BACTROBAN) 2 % Apply 1 application topically 3 (three) times daily. 02/21/19   Lattie HawBeese, Stephen A, MD  ondansetron (ZOFRAN-ODT) 4 MG disintegrating tablet Take 1 tablet (4 mg total) by mouth every 8 (eight) hours as needed for nausea or vomiting. 01/25/18   Lajean ManesMassey, David, MD  SUMAtriptan (IMITREX) 100 MG tablet May repeat in 2 hours if headache persists or recurs.  Maximum of 2 doses in 24 hours 01/25/18   Lajean ManesMassey, David, MD    Family History Family History  Problem Relation Age of Onset  . Heart attack Mother   . Hyperlipidemia Father   . Hypertension Father   . Stroke Father   . Diabetes Brother   . Hyperlipidemia Brother     Social History Social History   Tobacco Use  . Smoking status: Former Games developermoker  . Smokeless tobacco: Never Used  Substance Use  Topics  . Alcohol use: No  . Drug use: No     Allergies   Cleocin [clindamycin hcl]   Review of Systems Review of Systems  Constitutional: Negative for chills, diaphoresis and fatigue.  Musculoskeletal: Negative for joint swelling.  Skin: Positive for color change.  All other systems reviewed and are negative.    Physical Exam Triage Vital Signs ED Triage Vitals  Enc Vitals Group     BP 02/21/19 1504 109/74     Pulse Rate 02/21/19 1504 75     Resp 02/21/19 1504 16     Temp 02/21/19 1504 98.5 F (36.9 C)     Temp Source 02/21/19 1504 Oral     SpO2 02/21/19 1504 99 %     Weight 02/21/19 1506 173 lb (78.5 kg)     Height 02/21/19 1506 5\' 1"  (1.549 m)     Head Circumference --      Peak Flow --      Pain Score 02/21/19 1505 2     Pain Loc --      Pain Edu? --      Excl. in GC? --    No data found.  Updated Vital Signs BP 109/74 (BP Location: Right Arm)   Pulse 75   Temp 98.5 F (36.9 C) (Oral)   Resp 16  Ht 5\' 1"  (1.549 m)   Wt 78.5 kg   LMP 02/16/2019 (Approximate)   SpO2 99%   BMI 32.69 kg/m   Visual Acuity Right Eye Distance:   Left Eye Distance:   Bilateral Distance:    Right Eye Near:   Left Eye Near:    Bilateral Near:     Physical Exam Vitals signs and nursing note reviewed.  Constitutional:      General: She is not in acute distress. HENT:     Head: Normocephalic.  Eyes:     Pupils: Pupils are equal, round, and reactive to light.  Cardiovascular:     Rate and Rhythm: Normal rate.  Pulmonary:     Effort: Pulmonary effort is normal.  Musculoskeletal:       Hands:     Comments: The radial edge of right third fingernail is mildly swollen, erythematous and tender to palpation.  There is no induration, fluctuance, or discharge present.  Skin:    General: Skin is warm and dry.  Neurological:     Mental Status: She is alert.      UC Treatments / Results  Labs (all labs ordered are listed, but only abnormal results are displayed)  Labs Reviewed - No data to display  EKG   Radiology No results found.  Procedures Procedures (including critical care time)  Medications Ordered in UC Medications - No data to display  Initial Impression / Assessment and Plan / UC Course  I have reviewed the triage vital signs and the nursing notes.  Pertinent labs & imaging results that were available during my care of the patient were reviewed by me and considered in my medical decision making (see chart for details).    Fingertip is minimally infected and should respond to topical therapy. Begin Bactroban ointment TID.' Followup with Family Doctor if not improved in one week.    Final Clinical Impressions(s) / UC Diagnoses   Final diagnoses:  Paronychia, finger, right     Discharge Instructions       Soak the affected area in warm water 2 to 3 times daily.  Keep the finger dry when you are not soaking it and change the bandage daily.        ED Prescriptions    Medication Sig Dispense Auth. Provider   mupirocin ointment (BACTROBAN) 2 %  (Status: Discontinued) Apply 1 application topically 3 (three) times daily. 15 g Kandra Nicolas, MD   mupirocin ointment (BACTROBAN) 2 % Apply 1 application topically 3 (three) times daily. 15 g Kandra Nicolas, MD        Kandra Nicolas, MD 02/24/19 [place] Register    Kandra Nicolas, MD 02/24/19 [phone]

## 2019-02-21 NOTE — ED Triage Notes (Signed)
Patient had manicure/artificial nails removed 5 days ago; now her right middle finger nail circumference is reddened with purulent drainage. It is tender to touch. She has not travelled past 4 weeks.

## 2023-02-20 ENCOUNTER — Emergency Department (HOSPITAL_BASED_OUTPATIENT_CLINIC_OR_DEPARTMENT_OTHER)
Admission: EM | Admit: 2023-02-20 | Discharge: 2023-02-20 | Disposition: A | Discharge status: Home / Self Care | Payer: Federal, State, Local not specified - PPO | Attending: Emergency Medicine | Admitting: Emergency Medicine

## 2023-02-20 ENCOUNTER — Encounter (HOSPITAL_BASED_OUTPATIENT_CLINIC_OR_DEPARTMENT_OTHER): Payer: Self-pay | Admitting: Urology

## 2023-02-20 DIAGNOSIS — S0990XA Unspecified injury of head, initial encounter: Secondary | ICD-10-CM

## 2023-02-20 DIAGNOSIS — W1830XA Fall on same level, unspecified, initial encounter: Secondary | ICD-10-CM | POA: Insufficient documentation

## 2023-02-20 DIAGNOSIS — S0101XA Laceration without foreign body of scalp, initial encounter: Secondary | ICD-10-CM | POA: Diagnosis not present

## 2023-02-20 MED ORDER — LIDOCAINE-EPINEPHRINE (PF) 2 %-1:200000 IJ SOLN
10.0000 mL | Freq: Once | INTRAMUSCULAR | Status: AC
  Administered 2023-02-20: 10 mL
  Filled 2023-02-20: qty 20

## 2023-02-20 NOTE — ED Triage Notes (Signed)
Pt states [place] at [place], large lac noted to scalp Pt denies LOC, states dizziness and nausea though  Bleeding controlled at this time  States headache at this time

## 2023-02-20 NOTE — Discharge Instructions (Signed)
Please read and follow all provided instructions.  Your diagnoses today include:  1. Laceration of scalp, initial encounter   2. Minor head injury, initial encounter     Tests performed today include: Vital signs. See below for your results today.   Medications prescribed:  Please use over-the-counter NSAID medications (ibuprofen, naproxen) or Tylenol (acetaminophen) as directed on the packaging for pain -- as long as you do not have any reasons avoid these medications. Reasons to avoid NSAID medications include: weak kidneys, a history of bleeding in your stomach or gut, or uncontrolled high blood pressure or previous heart attack. Reasons to avoid Tylenol include: liver problems or ongoing alcohol use. Never take more than 4000mg  or 8 Extra strength Tylenol in a 24 hour period.     Take any prescribed medications only as directed.   Home care instructions:  Follow any educational materials and wound care instructions contained in this packet.   Keep affected area above the level of your heart when possible to minimize swelling. Wash area gently twice a day with warm soapy water. Do not apply alcohol or hydrogen peroxide. Cover the area if it draining or weeping.   Follow-up instructions: Suture Removal: Return to the Emergency Department or see your primary care care doctor in 7 days for a recheck of your wound and removal of your sutures or staples.    Return instructions:  Return to the Emergency Department if you have: Worsening severe headache, confusion, vomiting Fever Worsening pain Worsening swelling of the wound Pus draining from the wound Redness of the skin that moves away from the wound, especially if it streaks away from the affected area  Any other emergent concerns  Your vital signs today were: BP 122/86 (BP Location: Left Arm)   Pulse (!) 101   Temp 98 F (36.7 C) (Oral)   Resp 20   Ht 5\' 1"  (1.549 m)   Wt 78.5 kg   SpO2 100%   BMI 32.70 kg/m  If your  blood pressure (BP) was elevated above 135/85 this visit, please have this repeated by your doctor within one month. --------------

## 2023-02-20 NOTE — ED Provider Notes (Signed)
[place] [facility] Provider Note   CSN: [id number] Arrival date & time: 02/20/23  1204     History  Chief Complaint  Patient presents with   Laceration    Jacqueline Bryan is a 42 y.o. female.  Patient presents to the emergency department today for evaluation of a scalp laceration to the back of her head on the left side.  Symptoms started acutely while she was at church.  She states that that was secured, fell off of a stand and struck the back of her head.  She did not lose consciousness.  Initially she kept working but then he felt blood running down the back of her neck.  She has approximately 3 cm laceration.  Minimal headache.  No confusion or vomiting.  No weakness, numbness, or tingling in the arms of the legs.  No significant health problems.  No anticoagulation.       Home Medications Prior to Admission medications   Medication Sig Start Date End Date Taking? Authorizing Provider  escitalopram (LEXAPRO) 10 MG tablet Take 10 mg by mouth daily.    [provider]  mupirocin ointment (BACTROBAN) 2 % Apply 1 application topically 3 (three) times daily. 02/21/19   Lattie Haw, MD  ondansetron (ZOFRAN-ODT) 4 MG disintegrating tablet Take 1 tablet (4 mg total) by mouth every 8 (eight) hours as needed for nausea or vomiting. 01/25/18   Lajean Manes, MD  SUMAtriptan (IMITREX) 100 MG tablet May repeat in 2 hours if headache persists or recurs.  Maximum of 2 doses in 24 hours 01/25/18   Lajean Manes, MD      Allergies    Cleocin [clindamycin hcl]    Review of Systems   Review of Systems  Physical Exam Updated Vital Signs BP 122/86 (BP Location: Left Arm)   Pulse (!) 101   Temp 98 F (36.7 C) (Oral)   Resp 20   Ht 5\' 1"  (1.549 m)   Wt 78.5 kg   SpO2 100%   BMI 32.70 kg/m  Physical Exam Vitals and nursing note reviewed.  Constitutional:      Appearance: She is well-developed.  HENT:     Head: Normocephalic. No  raccoon eyes or Battle's sign.     Comments: 3.5 cm minimally gaping laceration, linear, hemostatic to the left occipital scalp.  Wound base appears clean.  It does not extend to the bone.  No foreign bodies noted.    Right Ear: External ear normal.     Left Ear: External ear normal.     Nose: Nose normal.     Mouth/Throat:     Pharynx: Uvula midline.  Eyes:     General: Lids are normal.     Extraocular Movements:     Right eye: No nystagmus.     Left eye: No nystagmus.     Conjunctiva/sclera: Conjunctivae normal.     Pupils: Pupils are equal, round, and reactive to light.     Comments: No visible hyphema noted  Cardiovascular:     Rate and Rhythm: Normal rate and regular rhythm.  Pulmonary:     Effort: Pulmonary effort is normal.     Breath sounds: Normal breath sounds.  Abdominal:     Palpations: Abdomen is soft.     Tenderness: There is no abdominal tenderness.  Musculoskeletal:     Cervical back: Normal range of motion and neck supple. No tenderness or bony tenderness.     Thoracic back: No  tenderness or bony tenderness.     Lumbar back: No tenderness or bony tenderness.  Skin:    General: Skin is warm and dry.  Neurological:     Mental Status: She is alert and oriented to person, place, and time.     GCS: GCS eye subscore is 4. GCS verbal subscore is 5. GCS motor subscore is 6.     Cranial Nerves: No cranial nerve deficit.     Sensory: No sensory deficit.     Coordination: Coordination normal.     ED Results / Procedures / Treatments   Labs (all labs ordered are listed, but only abnormal results are displayed) Labs Reviewed - No data to display  EKG None  Radiology No results found.  Procedures .Marland KitchenLaceration Repair  Date/Time: 02/20/2023 1:19 PM  Performed by: Renne Crigler, PA-C Authorized by: Renne Crigler, PA-C   Consent:    Consent obtained:  Verbal   Consent given by:  Patient   Risks discussed:  Infection and pain   Alternatives discussed:  No  treatment Universal protocol:    Patient identity confirmed:  Verbally with patient and provided demographic data Anesthesia:    Anesthesia method:  Local infiltration   Local anesthetic:  Lidocaine 2% WITH epi Laceration details:    Location:  Scalp   Scalp location:  Occipital   Length (cm):  3.5 Pre-procedure details:    Preparation:  Patient was prepped and draped in usual sterile fashion Exploration:    Imaging outcome: foreign body not noted     Wound exploration: wound explored through full range of motion     Wound extent: no foreign body     Contaminated: no   Treatment:    Area cleansed with:  Shur-Clens   Amount of cleaning:  Extensive Skin repair:    Repair method:  Staples   Number of staples:  5 Approximation:    Approximation:  Close Repair type:    Repair type:  Simple Post-procedure details:    Dressing:  Open (no dressing)   Procedure completion:  Tolerated well, no immediate complications     Medications Ordered in ED Medications  lidocaine-EPINEPHrine (XYLOCAINE W/EPI) 2 %-1:200000 (PF) injection 10 mL (10 mLs Infiltration Given by Other 02/20/23 1257)    ED Course/ Medical Decision Making/ A&P    Patient seen and examined. History obtained directly from patient.   Labs/EKG: None ordered  Imaging: None ordered.  Considered head imaging, negative [place] head CT rules.  Medications/Fluids: Ordered: Lidocaine 2% with epinephrine  Most recent vital signs reviewed and are as follows: BP 122/86 (BP Location: Left Arm)   Pulse (!) 101   Temp 98 F (36.7 C) (Oral)   Resp 20   Ht 5\' 1"  (1.549 m)   Wt 78.5 kg   SpO2 100%   BMI 32.70 kg/m   Initial impression: Scalp laceration, minor head injury  1:19 PM Reassessment performed. Patient appears comfortable. Exam unchanged.  No neurologic decompensation.  Reviewed pertinent lab work and imaging with patient at bedside. Questions answered.   Most current vital signs reviewed and are as  follows: BP 122/86 (BP Location: Left Arm)   Pulse (!) 101   Temp 98 F (36.7 C) (Oral)   Resp 20   Ht 5\' 1"  (1.549 m)   Wt 78.5 kg   SpO2 100%   BMI 32.70 kg/m   Plan: Discharge to home.   Prescriptions written for: None  Other home care instructions discussed: Patient counseled on wound  care.    ED return instructions discussed: Patient was urged to return to the Emergency Department urgently with worsening pain, swelling, expanding erythema especially if it streaks away from the affected area, fever, or if they have any other concerns.   Follow-up instructions discussed: Patient counseled on need to return or see PCP/urgent care for suture removal in 7 days.                                   Medical Decision Making Risk Prescription drug management.   Patient with minor head injury, scalp laceration.  Low concern for skull fracture closed head injury.  Negative [place] head CT rules and I do not feel that she needs imaging at this time.  No suspicion for cervical spine injury.  Wound repaired without complications.  The patient's vital signs, pertinent lab work and imaging were reviewed and interpreted as discussed in the ED course. Hospitalization was considered for further testing, treatments, or serial exams/observation. However as patient is well-appearing, has a stable exam, and reassuring studies today, I do not feel that they warrant admission at this time. This plan was discussed with the patient who verbalizes agreement and comfort with this plan and seems reliable and able to return to the Emergency Department with worsening or changing symptoms.          Final Clinical Impression(s) / ED Diagnoses Final diagnoses:  Laceration of scalp, initial encounter  Minor head injury, initial encounter    Rx / DC Orders ED Discharge Orders     None         Renne Crigler, PA-C 02/20/23 1320    Arby Barrette, MD 02/20/23 1620
